# Patient Record
Sex: Female | Born: 1935 | Race: White | Hispanic: No | Marital: Married
Health system: Midwestern US, Academic
[De-identification: ages and names within clinical notes are randomized; demographics above are authoritative.]

---

## 2016-06-17 ENCOUNTER — Encounter: Admit: 2016-06-17 | Discharge: 2016-06-17 | Payer: MEDICARE

## 2016-06-17 NOTE — Telephone Encounter
Called and spoke w/RN she reports transmission is not going through... I provided troubleshooting tips and a call back #    While awaiting return call I noted on the following:"04/11/2016 7:34:30 AM - ... Reviewed with Dr. Dewaine CongerWillhoite - pt is On Hospice - remote monitoring discontined per family's wishes - all cardiac meds d/c'd per family.  [facility]."

## 2016-06-17 NOTE — Telephone Encounter
-----   Message from Jeneen RinksJessica Bower sent at 06/17/2016 12:21 PM CDT -----  Regarding: Remote not going through   Darl PikesSusan with  [facility] calling with questions about PPM monitor connection.  [phone]

## 2016-06-21 ENCOUNTER — Encounter: Admit: 2016-06-21 | Discharge: 2016-06-21 | Payer: MEDICARE

## 2016-06-21 NOTE — Telephone Encounter
-----   Message from Allen NorrisLisa Luikart, RN sent at 06/21/2016 11:15 AM CDT -----  Regarding: Did remote transmit  Husband is not sure if remote transmitted.  It doesn't appear it did, can you call him back [phone].    Thank you!

## 2016-06-21 NOTE — Telephone Encounter
Pt is on hospice and remote d/c in April 2018. Called pt's husband and he forgot that was the conversation in April. He was happy with update and will recycle or trash the carelink monitor. He was happy with the follow up.

## 2016-06-30 ENCOUNTER — Ambulatory Visit: Admit: 2016-06-30 | Discharge: 2016-06-30 | Payer: MEDICARE

## 2016-06-30 DIAGNOSIS — H903 Sensorineural hearing loss, bilateral: Principal | ICD-10-CM

## 2016-06-30 NOTE — Progress Notes
Dispensed 2 packs size 312 hearing aid batteries.

## 2016-08-04 ENCOUNTER — Encounter: Admit: 2016-08-04 | Discharge: 2016-08-04 | Payer: MEDICARE

## 2016-08-10 ENCOUNTER — Encounter: Admit: 2016-08-10 | Discharge: 2016-08-10 | Payer: MEDICARE

## 2016-08-10 DIAGNOSIS — I495 Sick sinus syndrome: Principal | ICD-10-CM

## 2016-08-10 NOTE — Telephone Encounter
Autumn from [facility] called stating that hospice has been discontinued and remote monitoring needs to be resumed per patient wishes.  Flag sent to EP remote.     Autumn [facility] [phone]

## 2016-08-18 ENCOUNTER — Ambulatory Visit: Admit: 2016-08-18 | Discharge: 2016-08-19 | Payer: MEDICARE

## 2016-08-18 DIAGNOSIS — I495 Sick sinus syndrome: Principal | ICD-10-CM

## 2017-03-09 ENCOUNTER — Encounter: Admit: 2017-03-09 | Discharge: 2017-03-09 | Payer: MEDICARE

## 2017-03-13 ENCOUNTER — Encounter: Admit: 2017-03-13 | Discharge: 2017-03-13 | Payer: MEDICARE

## 2017-03-15 ENCOUNTER — Encounter: Admit: 2017-03-15 | Discharge: 2017-03-15 | Payer: MEDICARE

## 2017-03-15 ENCOUNTER — Ambulatory Visit: Admit: 2017-03-15 | Discharge: 2017-03-16 | Payer: MEDICARE

## 2017-03-15 DIAGNOSIS — Z959 Presence of cardiac and vascular implant and graft, unspecified: Principal | ICD-10-CM

## 2017-03-16 ENCOUNTER — Encounter: Admit: 2017-03-16 | Discharge: 2017-03-16 | Payer: MEDICARE

## 2017-03-16 DIAGNOSIS — Z950 Presence of cardiac pacemaker: Principal | ICD-10-CM

## 2017-03-16 DIAGNOSIS — Z959 Presence of cardiac and vascular implant and graft, unspecified: Principal | ICD-10-CM

## 2017-03-21 ENCOUNTER — Ambulatory Visit: Admit: 2017-03-21 | Discharge: 2017-03-22 | Payer: MEDICARE

## 2017-03-21 ENCOUNTER — Encounter: Admit: 2017-03-21 | Discharge: 2017-03-21 | Payer: MEDICARE

## 2017-03-21 DIAGNOSIS — I10 Essential (primary) hypertension: ICD-10-CM

## 2017-03-21 DIAGNOSIS — N189 Chronic kidney disease, unspecified: ICD-10-CM

## 2017-03-21 DIAGNOSIS — G9349 Other encephalopathy: ICD-10-CM

## 2017-03-21 DIAGNOSIS — I4891 Unspecified atrial fibrillation: ICD-10-CM

## 2017-03-21 DIAGNOSIS — I480 Paroxysmal atrial fibrillation: ICD-10-CM

## 2017-03-21 DIAGNOSIS — R42 Dizziness and giddiness: ICD-10-CM

## 2017-03-21 DIAGNOSIS — I471 Supraventricular tachycardia: ICD-10-CM

## 2017-03-21 DIAGNOSIS — IMO0002 Unspecified hypertension complicating pregnancy, childbirth, or the puerperium, unspecified as to episode of care: Principal | ICD-10-CM

## 2017-03-21 DIAGNOSIS — Z950 Presence of cardiac pacemaker: Principal | ICD-10-CM

## 2017-03-21 DIAGNOSIS — I495 Sick sinus syndrome: ICD-10-CM

## 2017-03-21 DIAGNOSIS — E785 Hyperlipidemia, unspecified: ICD-10-CM

## 2017-03-21 DIAGNOSIS — E039 Hypothyroidism, unspecified: ICD-10-CM

## 2017-03-23 ENCOUNTER — Encounter: Admit: 2017-03-23 | Discharge: 2017-03-23 | Payer: MEDICARE

## 2017-04-10 ENCOUNTER — Encounter: Admit: 2017-04-10 | Discharge: 2017-04-10 | Payer: MEDICARE

## 2017-04-14 ENCOUNTER — Encounter: Admit: 2017-04-14 | Discharge: 2017-04-14 | Payer: MEDICARE

## 2017-04-14 ENCOUNTER — Ambulatory Visit: Admit: 2017-04-14 | Discharge: 2017-04-15 | Payer: MEDICARE

## 2017-04-14 DIAGNOSIS — Z950 Presence of cardiac pacemaker: ICD-10-CM

## 2017-04-14 DIAGNOSIS — I480 Paroxysmal atrial fibrillation: Principal | ICD-10-CM

## 2017-04-15 DIAGNOSIS — I480 Paroxysmal atrial fibrillation: ICD-10-CM

## 2017-04-15 DIAGNOSIS — Z950 Presence of cardiac pacemaker: ICD-10-CM

## 2017-04-15 DIAGNOSIS — I495 Sick sinus syndrome: Principal | ICD-10-CM

## 2017-04-18 ENCOUNTER — Ambulatory Visit: Admit: 2017-04-18 | Discharge: 2017-04-19 | Payer: MEDICARE

## 2017-04-18 DIAGNOSIS — Z950 Presence of cardiac pacemaker: Principal | ICD-10-CM

## 2017-05-15 ENCOUNTER — Encounter: Admit: 2017-05-15 | Discharge: 2017-05-15 | Payer: MEDICARE

## 2017-07-19 ENCOUNTER — Encounter: Admit: 2017-07-19 | Discharge: 2017-07-19 | Payer: MEDICARE

## 2017-07-20 ENCOUNTER — Encounter: Admit: 2017-07-20 | Discharge: 2017-07-20 | Payer: MEDICARE

## 2017-07-20 ENCOUNTER — Ambulatory Visit: Admit: 2017-07-19 | Discharge: 2017-07-20 | Payer: MEDICARE

## 2017-07-20 DIAGNOSIS — I495 Sick sinus syndrome: Principal | ICD-10-CM

## 2017-07-20 DIAGNOSIS — I480 Paroxysmal atrial fibrillation: Secondary | ICD-10-CM

## 2017-07-20 DIAGNOSIS — Z950 Presence of cardiac pacemaker: ICD-10-CM

## 2017-07-27 ENCOUNTER — Ambulatory Visit: Admit: 2017-07-27 | Discharge: 2017-07-28 | Payer: MEDICARE

## 2017-07-27 ENCOUNTER — Encounter: Admit: 2017-07-27 | Discharge: 2017-07-27 | Payer: MEDICARE

## 2017-07-27 DIAGNOSIS — G9349 Other encephalopathy: ICD-10-CM

## 2017-07-27 DIAGNOSIS — R42 Dizziness and giddiness: ICD-10-CM

## 2017-07-27 DIAGNOSIS — I10 Essential (primary) hypertension: ICD-10-CM

## 2017-07-27 DIAGNOSIS — I471 Supraventricular tachycardia: ICD-10-CM

## 2017-07-27 DIAGNOSIS — I2510 Atherosclerotic heart disease of native coronary artery without angina pectoris: ICD-10-CM

## 2017-07-27 DIAGNOSIS — I4891 Unspecified atrial fibrillation: ICD-10-CM

## 2017-07-27 DIAGNOSIS — E785 Hyperlipidemia, unspecified: ICD-10-CM

## 2017-07-27 DIAGNOSIS — I495 Sick sinus syndrome: Principal | ICD-10-CM

## 2017-07-27 DIAGNOSIS — Z950 Presence of cardiac pacemaker: ICD-10-CM

## 2017-07-27 DIAGNOSIS — I480 Paroxysmal atrial fibrillation: ICD-10-CM

## 2017-07-27 DIAGNOSIS — IMO0002 Unspecified hypertension complicating pregnancy, childbirth, or the puerperium, unspecified as to episode of care: Principal | ICD-10-CM

## 2017-07-27 DIAGNOSIS — E039 Hypothyroidism, unspecified: Secondary | ICD-10-CM

## 2017-07-27 DIAGNOSIS — N189 Chronic kidney disease, unspecified: ICD-10-CM

## 2017-08-04 LAB — DIGOXIN LEVEL: Lab: 0.9

## 2017-08-07 ENCOUNTER — Encounter: Admit: 2017-08-07 | Discharge: 2017-08-07 | Payer: MEDICARE

## 2017-08-23 ENCOUNTER — Ambulatory Visit: Admit: 2017-08-23 | Discharge: 2017-08-23 | Payer: MEDICARE

## 2017-08-24 ENCOUNTER — Encounter: Admit: 2017-08-24 | Discharge: 2017-08-24 | Payer: MEDICARE

## 2017-09-01 ENCOUNTER — Encounter: Admit: 2017-09-01 | Discharge: 2017-09-01 | Payer: MEDICARE

## 2017-09-07 ENCOUNTER — Encounter: Admit: 2017-09-07 | Discharge: 2017-09-07 | Payer: MEDICARE

## 2017-09-07 ENCOUNTER — Ambulatory Visit: Admit: 2017-09-07 | Discharge: 2017-09-08 | Payer: MEDICARE

## 2017-09-07 DIAGNOSIS — I480 Paroxysmal atrial fibrillation: Principal | ICD-10-CM

## 2017-09-07 DIAGNOSIS — I10 Essential (primary) hypertension: ICD-10-CM

## 2017-09-07 DIAGNOSIS — IMO0002 Unspecified hypertension complicating pregnancy, childbirth, or the puerperium, unspecified as to episode of care: Principal | ICD-10-CM

## 2017-09-07 DIAGNOSIS — R42 Dizziness and giddiness: ICD-10-CM

## 2017-09-07 DIAGNOSIS — E039 Hypothyroidism, unspecified: Secondary | ICD-10-CM

## 2017-09-07 DIAGNOSIS — E785 Hyperlipidemia, unspecified: Secondary | ICD-10-CM

## 2017-09-07 DIAGNOSIS — G9349 Other encephalopathy: ICD-10-CM

## 2017-09-07 DIAGNOSIS — I4891 Unspecified atrial fibrillation: ICD-10-CM

## 2017-09-07 DIAGNOSIS — N189 Chronic kidney disease, unspecified: ICD-10-CM

## 2017-09-07 DIAGNOSIS — I495 Sick sinus syndrome: ICD-10-CM

## 2017-09-07 DIAGNOSIS — I471 Supraventricular tachycardia: ICD-10-CM

## 2017-09-07 MED ORDER — METOPROLOL SUCCINATE 25 MG PO TB24
ORAL_TABLET | ORAL | 3 refills | 90.00000 days | Status: AC | PRN
Start: 2017-09-07 — End: 2017-10-03

## 2017-10-02 ENCOUNTER — Encounter: Admit: 2017-10-02 | Discharge: 2017-10-02 | Payer: MEDICARE

## 2017-10-03 MED ORDER — METOPROLOL SUCCINATE 25 MG PO TB24
ORAL_TABLET | Freq: Every day | ORAL | 3 refills | 90.00000 days | Status: AC | PRN
Start: 2017-10-03 — End: 2017-12-28

## 2017-10-18 ENCOUNTER — Ambulatory Visit: Admit: 2017-10-18 | Discharge: 2017-10-19 | Payer: MEDICARE

## 2017-10-18 ENCOUNTER — Encounter: Admit: 2017-10-18 | Discharge: 2017-10-18 | Payer: MEDICARE

## 2017-10-19 DIAGNOSIS — Z950 Presence of cardiac pacemaker: ICD-10-CM

## 2017-10-19 DIAGNOSIS — I480 Paroxysmal atrial fibrillation: Principal | ICD-10-CM

## 2017-10-30 ENCOUNTER — Encounter: Admit: 2017-10-30 | Discharge: 2017-10-30 | Payer: MEDICARE

## 2017-10-30 DIAGNOSIS — I480 Paroxysmal atrial fibrillation: Principal | ICD-10-CM

## 2017-10-30 DIAGNOSIS — I4891 Unspecified atrial fibrillation: ICD-10-CM

## 2017-11-09 ENCOUNTER — Ambulatory Visit: Admit: 2017-11-09 | Discharge: 2017-11-09 | Payer: MEDICARE

## 2017-11-09 LAB — COMPREHENSIVE METABOLIC PANEL
Lab: 1.1 — ABNORMAL HIGH (ref 0.57–1.11)
Lab: 1.6 — ABNORMAL HIGH (ref 0.0–1.0)
Lab: 102
Lab: 110 — ABNORMAL HIGH (ref 98–107)
Lab: 143 — ABNORMAL HIGH (ref 4.20–5.40)
Lab: 18 — ABNORMAL HIGH (ref 0–14)
Lab: 19 — ABNORMAL LOW (ref 23–31)
Lab: 20 — ABNORMAL LOW (ref 27.0–31.0)
Lab: 22
Lab: 25
Lab: 3.9
Lab: 4.2
Lab: 48 — ABNORMAL LOW (ref >59)
Lab: 7 — ABNORMAL HIGH (ref 11.5–14.5)
Lab: 81
Lab: 9.6

## 2017-11-09 LAB — CBC: Lab: 8.1

## 2017-12-15 ENCOUNTER — Encounter: Admit: 2017-12-15 | Discharge: 2017-12-15 | Payer: MEDICARE

## 2017-12-28 ENCOUNTER — Ambulatory Visit: Admit: 2017-12-28 | Discharge: 2017-12-28 | Payer: MEDICARE

## 2017-12-28 ENCOUNTER — Encounter: Admit: 2017-12-28 | Discharge: 2017-12-28 | Payer: MEDICARE

## 2017-12-28 DIAGNOSIS — I4891 Unspecified atrial fibrillation: ICD-10-CM

## 2017-12-28 DIAGNOSIS — I480 Paroxysmal atrial fibrillation: ICD-10-CM

## 2017-12-28 DIAGNOSIS — I10 Essential (primary) hypertension: ICD-10-CM

## 2017-12-28 DIAGNOSIS — IMO0002 Unspecified hypertension complicating pregnancy, childbirth, or the puerperium, unspecified as to episode of care: Principal | ICD-10-CM

## 2017-12-28 DIAGNOSIS — G9349 Other encephalopathy: ICD-10-CM

## 2017-12-28 DIAGNOSIS — N189 Chronic kidney disease, unspecified: ICD-10-CM

## 2017-12-28 DIAGNOSIS — I495 Sick sinus syndrome: ICD-10-CM

## 2017-12-28 DIAGNOSIS — E785 Hyperlipidemia, unspecified: ICD-10-CM

## 2017-12-28 DIAGNOSIS — R42 Dizziness and giddiness: ICD-10-CM

## 2017-12-28 DIAGNOSIS — E039 Hypothyroidism, unspecified: Secondary | ICD-10-CM

## 2017-12-28 DIAGNOSIS — I471 Supraventricular tachycardia: ICD-10-CM

## 2017-12-28 MED ORDER — DIGOXIN 125 MCG (0.125 MG) PO TAB
125 ug | ORAL_TABLET | Freq: Every day | ORAL | 3 refills | 30.00000 days | Status: AC
Start: 2017-12-28 — End: ?

## 2017-12-28 MED ORDER — FUROSEMIDE 20 MG PO TAB
20 mg | ORAL_TABLET | Freq: Two times a day (BID) | ORAL | 3 refills | 90.00000 days | Status: AC
Start: 2017-12-28 — End: 2018-02-08

## 2017-12-29 ENCOUNTER — Encounter: Admit: 2017-12-29 | Discharge: 2017-12-29 | Payer: MEDICARE

## 2017-12-29 DIAGNOSIS — I4891 Unspecified atrial fibrillation: ICD-10-CM

## 2017-12-29 DIAGNOSIS — I10 Essential (primary) hypertension: Principal | ICD-10-CM

## 2017-12-29 LAB — BASIC METABOLIC PANEL
Lab: 1.1
Lab: 141
Lab: 16
Lab: 4.2
Lab: 72
Lab: 109 — ABNORMAL HIGH (ref 98–107)
Lab: 24
Lab: 9.8

## 2018-01-09 LAB — BASIC METABOLIC PANEL
Lab: 1
Lab: 104
Lab: 14
Lab: 140
Lab: 24
Lab: 4.6
Lab: 92

## 2018-01-09 LAB — DIGOXIN LEVEL: Lab: 1.1 (ref 0.80–2.00)

## 2018-01-11 ENCOUNTER — Encounter: Admit: 2018-01-11 | Discharge: 2018-01-11 | Payer: MEDICARE

## 2018-01-11 DIAGNOSIS — I10 Essential (primary) hypertension: Secondary | ICD-10-CM

## 2018-01-11 DIAGNOSIS — I4891 Unspecified atrial fibrillation: Secondary | ICD-10-CM

## 2018-01-16 ENCOUNTER — Encounter: Admit: 2018-01-16 | Discharge: 2018-01-16 | Payer: MEDICARE

## 2018-01-16 MED ORDER — DILTIAZEM HCL 240 MG PO CP24
240 mg | ORAL_CAPSULE | Freq: Two times a day (BID) | ORAL | 3 refills | 45.00000 days | Status: AC
Start: 2018-01-16 — End: ?

## 2018-01-17 ENCOUNTER — Ambulatory Visit: Admit: 2018-01-17 | Discharge: 2018-01-18 | Payer: MEDICARE

## 2018-01-18 ENCOUNTER — Encounter: Admit: 2018-01-18 | Discharge: 2018-01-18 | Payer: MEDICARE

## 2018-01-18 DIAGNOSIS — I480 Paroxysmal atrial fibrillation: Secondary | ICD-10-CM

## 2018-01-18 DIAGNOSIS — Z950 Presence of cardiac pacemaker: Secondary | ICD-10-CM

## 2018-01-18 DIAGNOSIS — I495 Sick sinus syndrome: Secondary | ICD-10-CM

## 2018-02-08 ENCOUNTER — Encounter: Admit: 2018-02-08 | Discharge: 2018-02-08 | Payer: MEDICARE

## 2018-02-08 ENCOUNTER — Ambulatory Visit: Admit: 2018-02-08 | Discharge: 2018-02-08 | Payer: MEDICARE

## 2018-02-08 DIAGNOSIS — I10 Essential (primary) hypertension: ICD-10-CM

## 2018-02-08 DIAGNOSIS — N189 Chronic kidney disease, unspecified: ICD-10-CM

## 2018-02-08 DIAGNOSIS — E785 Hyperlipidemia, unspecified: ICD-10-CM

## 2018-02-08 DIAGNOSIS — I480 Paroxysmal atrial fibrillation: ICD-10-CM

## 2018-02-08 DIAGNOSIS — I495 Sick sinus syndrome: ICD-10-CM

## 2018-02-08 DIAGNOSIS — I471 Supraventricular tachycardia: ICD-10-CM

## 2018-02-08 DIAGNOSIS — IMO0002 Unspecified hypertension complicating pregnancy, childbirth, or the puerperium, unspecified as to episode of care: Principal | ICD-10-CM

## 2018-02-08 DIAGNOSIS — G9349 Other encephalopathy: ICD-10-CM

## 2018-02-08 DIAGNOSIS — R42 Dizziness and giddiness: ICD-10-CM

## 2018-02-08 DIAGNOSIS — E039 Hypothyroidism, unspecified: Secondary | ICD-10-CM

## 2018-02-08 DIAGNOSIS — I4891 Unspecified atrial fibrillation: ICD-10-CM

## 2018-02-08 LAB — BASIC METABOLIC PANEL
Lab: 1
Lab: 12
Lab: 140
Lab: 16 — ABNORMAL HIGH (ref 0–14)
Lab: 24
Lab: 4.4
Lab: 52 — ABNORMAL LOW (ref >59)
Lab: 9.2
Lab: 96

## 2018-02-08 LAB — DIGOXIN LEVEL: Lab: 1.1

## 2018-02-08 NOTE — Progress Notes
Model Number:  [id number]      Serial Number:  [id number]      Implant Date:  04/27/2011      Carelink -remote monitoring established     ??? Hypothyroidism 06/20/2011     04/08/13 TSH 4.25 when eval'd for atrial fibrillation      ??? Nonobstructive Coronary atherosclerosis 06/20/2011     07/10/11 Reg thall: Normal IN inferior defect possibly ischemia  06/20/11: Normal EF and LVEDP; mild plaquing, but no obstructive CAD, per cath by Dr. Steward Ros  5/15 Reg Thall d/t DOE: EF 55% same inferior defect as prior.      ??? Dyspnea 06/08/2011     05/24/11 CXR [place]  No evident acute cardiopulmonary disease.   05/24/11 BNP 361  05/02/11 PFT's Normal pulmonary function studies.      ??? Sick sinus syndrome (HCC) 04/26/2011     Pacer implant     ??? PAT (paroxysmal atrial tachycardia) (HCC) 03/20/2011     133 bpm on holter, +ve in lead V1 And -ve lead II    03/2010 PAF? In hospital.     ??? Hypertension 03/11/2010   ??? HLD (hyperlipidemia) 03/11/2010   ??? PAF (paroxysmal atrial fibrillation) (HCC) 03/11/2010     PAF Rx Mltaq, Amio both DC d/t vague sx dizziness, not resolved off drugs  04/08/13 AF RVR @ 133 Dr. Gilles Chiquito office oof oral anticoagulation and anti-arrhythmic drug. Bak in SR next day after increase atenolol, Rx Xarelto 20/doay     ??? 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  No history of trauma or falling, although I suspect that a minor trauma in the presence of aspirin might lead to some ecchymoses.  She denies nose bleeds or sinus pain.  No change in her extraocular movement mobility.  No eye pain.    ([id number])        Review of Systems   Constitution: Positive for malaise/fatigue and weight loss.   HENT: Positive for hoarse voice.    Eyes: Positive for blurred vision.   Cardiovascular: Positive for dyspnea on exertion, irregular heartbeat, leg swelling, orthopnea and paroxysmal nocturnal dyspnea. ??? acetaminophen (TYLENOL) 650 mg rectal suppository Insert or Apply 650 mg to rectal area as directed as Needed for Pain.   ??? aspirin EC 81 mg tablet Take 81 mg by mouth daily. Take with food.   ??? Carboxymethylcellulose-Glycern (OPTIVE) 0.5-0.9 % drop Apply 1 drop to both eyes three times daily.   ??? digoxin (LANOXIN) 125 mcg (0.125 mg) tablet Take one tablet by mouth daily.   ??? diltiazem CD (CARDIZEM CD) 240 mg capsule Take one capsule by mouth twice daily.   ??? furosemide (LASIX) 40 mg tablet Take 1 tablet by mouth daily.   ??? levothyroxine (SYNTHROID) 50 mcg tablet Take 50 mcg by mouth daily.   ??? magnesium hydroxide (MILK OF MAGNESIA) 400 mg/5 mL oral suspension Take 30 mL by mouth every 48 hours. Indications: constipation   ??? meclizine (ANTIVERT) 25 mg tablet Take 25 mg by mouth three times daily.   ??? ondansetron (ZOFRAN ODT) 4 mg rapid dissolve tablet Dissolve 4 mg by mouth every 4 hours as needed for Nausea or Vomiting. Place on tongue to disolve.    ??? senna/docusate (SENOKOT-S) 8.6/50 mg tablet Take 1 Tab by mouth twice daily. Take while taking pain medication

## 2018-04-18 ENCOUNTER — Ambulatory Visit: Admit: 2018-04-18 | Discharge: 2018-04-18 | Payer: MEDICARE

## 2018-04-18 DIAGNOSIS — Z950 Presence of cardiac pacemaker: ICD-10-CM

## 2018-04-18 DIAGNOSIS — I495 Sick sinus syndrome: Principal | ICD-10-CM

## 2018-04-18 DIAGNOSIS — I480 Paroxysmal atrial fibrillation: Secondary | ICD-10-CM

## 2018-04-19 ENCOUNTER — Encounter: Admit: 2018-04-19 | Discharge: 2018-04-19 | Payer: MEDICARE

## 2018-04-26 ENCOUNTER — Encounter: Admit: 2018-04-26 | Discharge: 2018-04-26 | Payer: MEDICARE

## 2018-04-27 ENCOUNTER — Encounter: Admit: 2018-04-27 | Discharge: 2018-04-27 | Payer: MEDICARE

## 2018-04-27 NOTE — Telephone Encounter
Pt was scheduled for annual device check only on 05/03/18 in [place]. I called and spoke to husband.  They will send remote on this day.  She has had recent AFib with RVR and thought it best to f/u on this.

## 2018-07-25 ENCOUNTER — Encounter: Admit: 2018-07-25 | Discharge: 2018-07-25

## 2018-07-26 ENCOUNTER — Encounter: Admit: 2018-07-26 | Discharge: 2018-07-26

## 2018-07-26 ENCOUNTER — Ambulatory Visit: Admit: 2018-07-26 | Discharge: 2018-07-26

## 2018-07-26 DIAGNOSIS — I480 Paroxysmal atrial fibrillation: Secondary | ICD-10-CM

## 2018-07-26 DIAGNOSIS — I495 Sick sinus syndrome: Secondary | ICD-10-CM

## 2018-07-26 DIAGNOSIS — Z950 Presence of cardiac pacemaker: Secondary | ICD-10-CM

## 2018-07-26 NOTE — Progress Notes
PPM order entered and routed to [facility] staff for scheduling.    ----- Message -----  From: Mercy Riding  Sent: 07/26/2018   7:36 AM CDT  To: Cvm Nurse [place]  Subject: Device fu                                        Pt is overdue for an in-office device check.

## 2018-07-26 NOTE — Telephone Encounter
I called and LM letting patient's husband know that we received the Carelink transmission.

## 2018-07-26 NOTE — Telephone Encounter
-----   Message from Ayesha Rumpf, RN sent at 07/25/2018 11:39 AM CDT -----  Regarding: Remote Rcv'd? - MDT  Patient/husband was to send missed 7/15 remote interrogation yesterday afternoon. Please make sure it was received & let the husband know. Thank you.   ----- Message -----  From: Melvern Sample  Sent: 07/25/2018  10:32 AM CDT  To: Cherlyn Labella Remote  Subject: device check                                     Husband wants to know when we will check pm on wife

## 2018-08-03 ENCOUNTER — Encounter: Admit: 2018-08-03 | Discharge: 2018-08-03

## 2018-09-05 ENCOUNTER — Encounter: Admit: 2018-09-05 | Discharge: 2018-09-05

## 2018-09-06 ENCOUNTER — Encounter: Admit: 2018-09-06 | Discharge: 2018-09-06

## 2019-01-14 ENCOUNTER — Encounter: Admit: 2019-01-14 | Discharge: 2019-01-14 | Payer: MEDICARE

## 2019-02-08 ENCOUNTER — Encounter: Admit: 2019-02-08 | Discharge: 2019-02-08 | Payer: MEDICARE

## 2019-02-19 ENCOUNTER — Encounter: Admit: 2019-02-19 | Discharge: 2019-02-19 | Payer: MEDICARE

## 2019-02-19 DIAGNOSIS — Z950 Presence of cardiac pacemaker: Secondary | ICD-10-CM

## 2019-02-19 DIAGNOSIS — I495 Sick sinus syndrome: Secondary | ICD-10-CM

## 2019-02-19 DIAGNOSIS — I480 Paroxysmal atrial fibrillation: Secondary | ICD-10-CM

## 2019-02-27 ENCOUNTER — Ambulatory Visit: Admit: 2019-02-27 | Discharge: 2019-02-27 | Payer: MEDICARE

## 2019-02-27 DIAGNOSIS — I495 Sick sinus syndrome: Secondary | ICD-10-CM

## 2019-02-27 DIAGNOSIS — Z950 Presence of cardiac pacemaker: Secondary | ICD-10-CM

## 2019-02-27 DIAGNOSIS — I480 Paroxysmal atrial fibrillation: Secondary | ICD-10-CM

## 2019-03-01 ENCOUNTER — Encounter: Admit: 2019-03-01 | Discharge: 2019-03-01 | Payer: MEDICARE

## 2019-03-01 DIAGNOSIS — I495 Sick sinus syndrome: Secondary | ICD-10-CM

## 2019-03-01 DIAGNOSIS — Z950 Presence of cardiac pacemaker: Secondary | ICD-10-CM

## 2019-03-01 DIAGNOSIS — I480 Paroxysmal atrial fibrillation: Secondary | ICD-10-CM

## 2019-05-24 ENCOUNTER — Encounter: Admit: 2019-05-24 | Discharge: 2019-05-24 | Payer: MEDICARE

## 2019-06-01 IMAGING — CT CT head/brain wo con
3 of 4 series · 14 of 47 positions shown, 16 images · non-contrast
Comparison: none

[Series 4: brain cor 5.00 hr40 s3 · coronal · 0.30mm/px · 3 of 40 slices shown]
[im 14/40  brain]
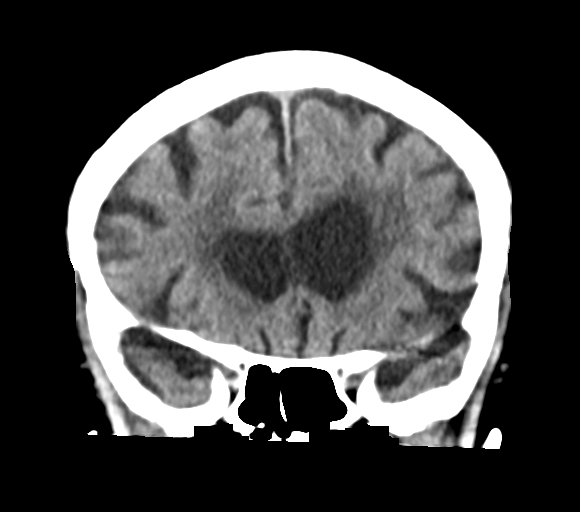
[im 18/40  brain]
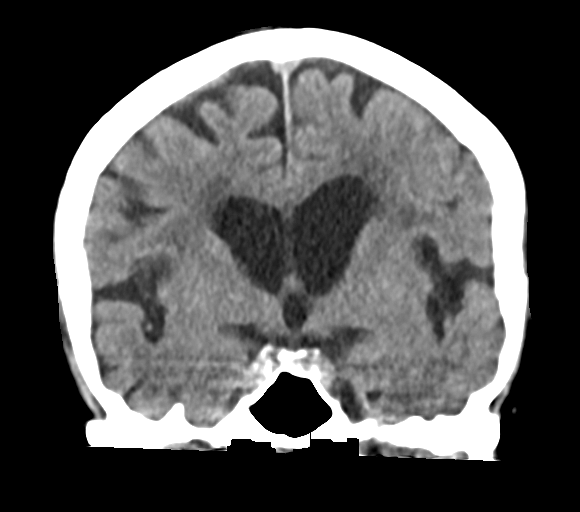
[im 22/40  brain]
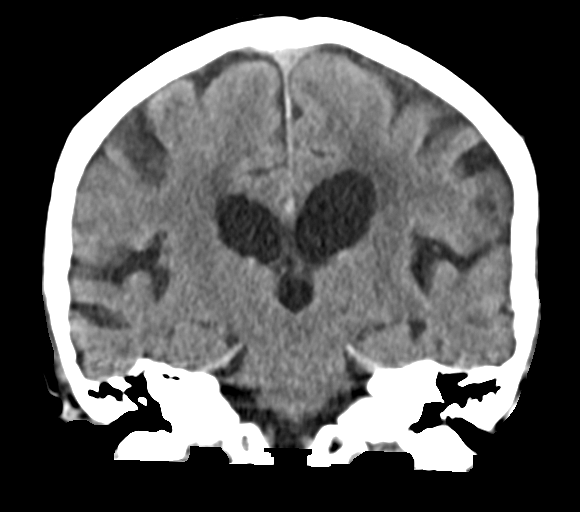

[Series 6: brain sag 5.00 hr40 s3 · sagittal · 0.30mm/px · 3 of 34 slices shown]
[im 12/34  brain]
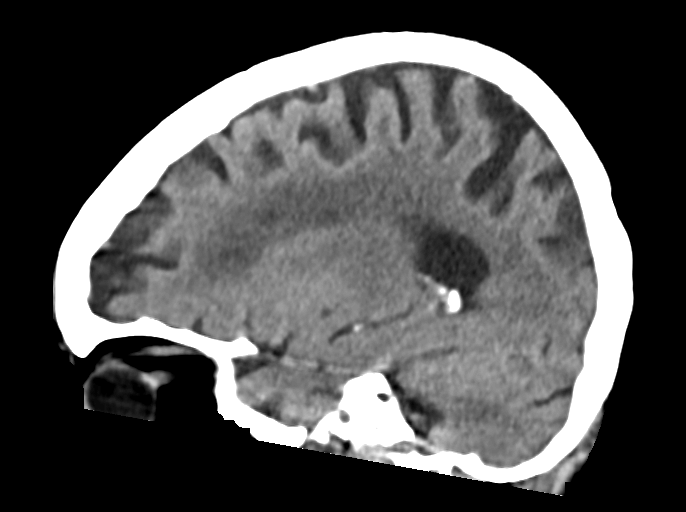
[im 17/34  brain]
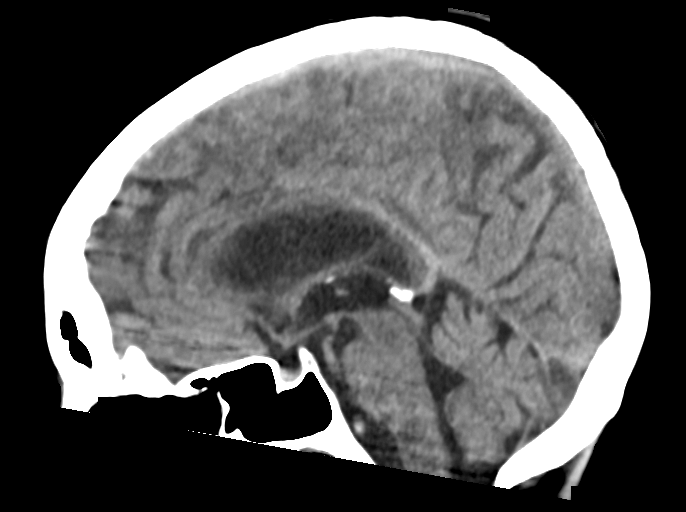
[im 23/34  brain]
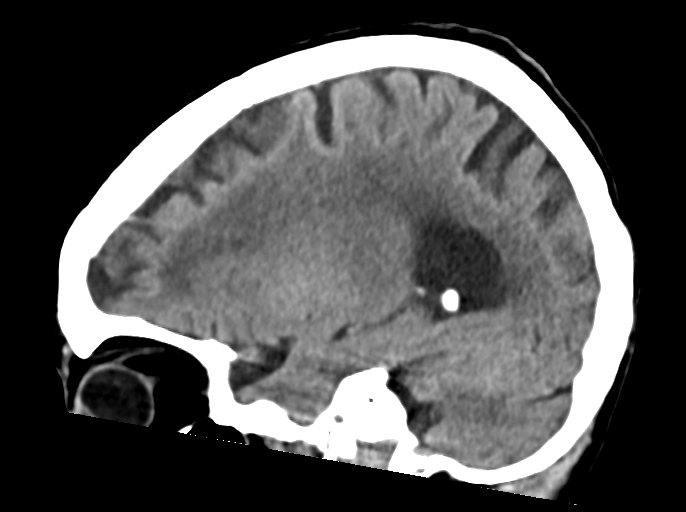

[Series 8: brain ax 5.00 hr60 s3 · axial · 0.34mm/px · z∈[-624,-506]mm · 8 of 30 slices shown, 10 images]
[im 3/30  brain]
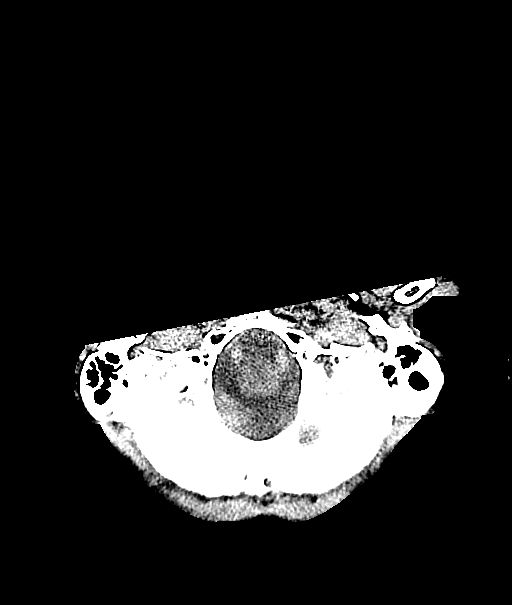
[im 3/30  bone]
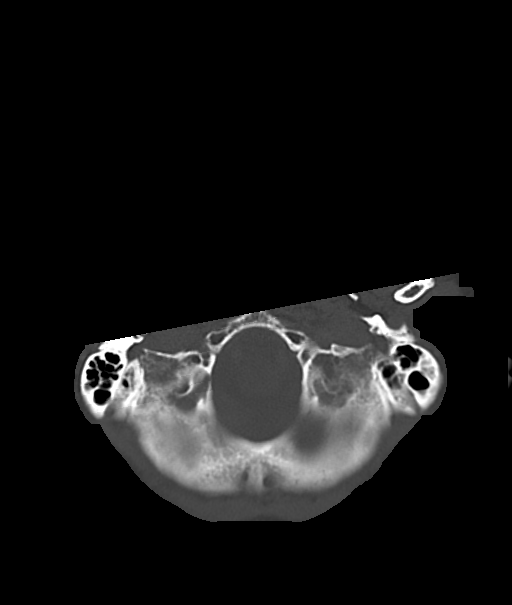
[im 7/30  brain]
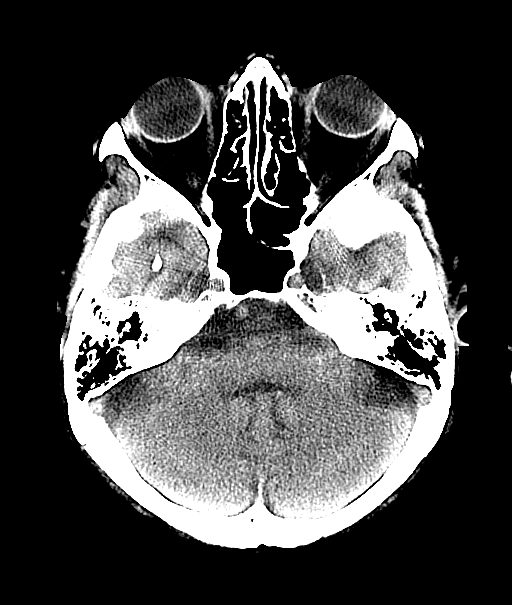
[im 11/30  brain]
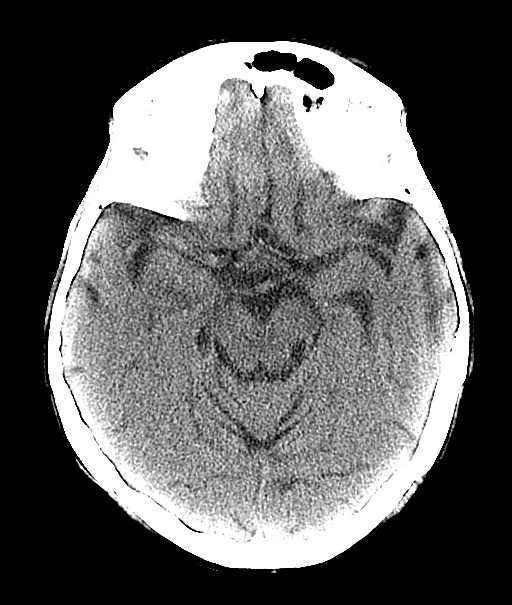
[im 13/30  brain]
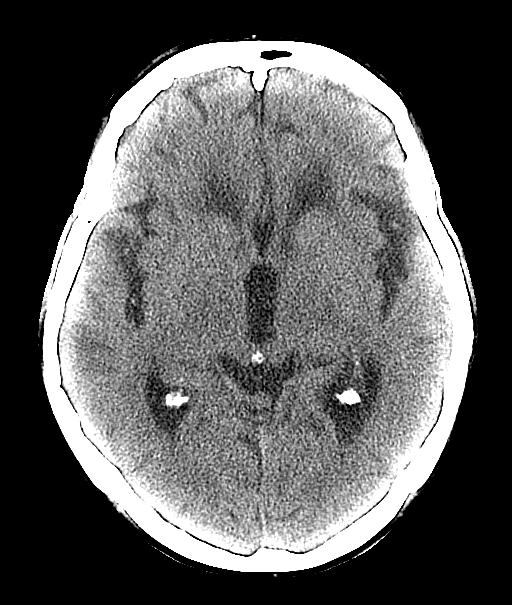
[im 17/30  brain]
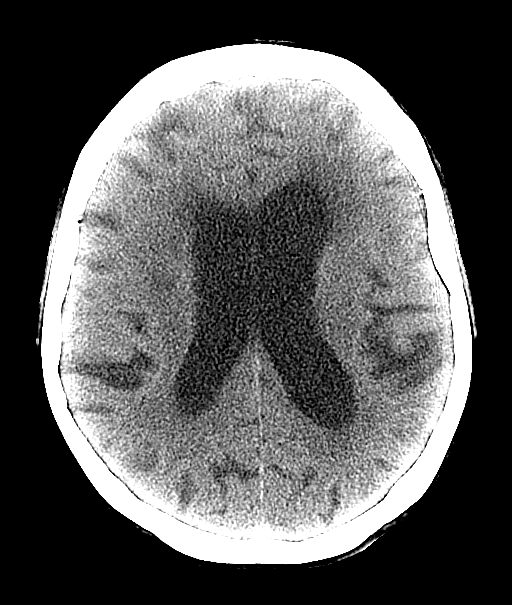
[im 17/30  bone]
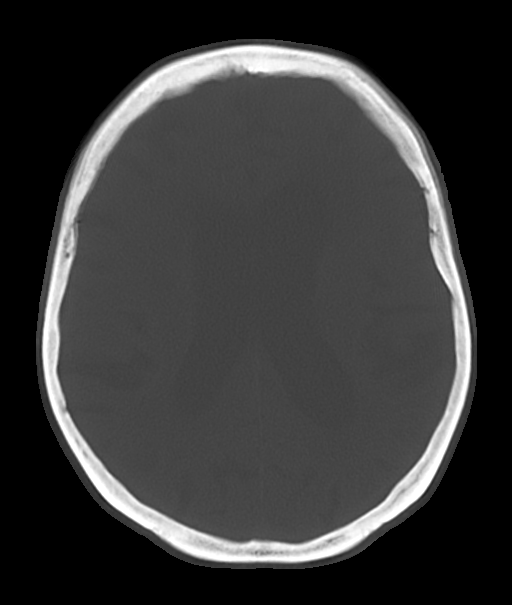
[im 19/30  brain]
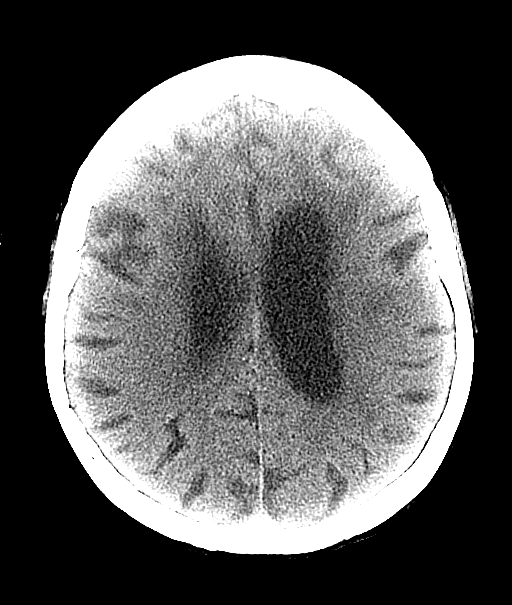
[im 23/30  brain]
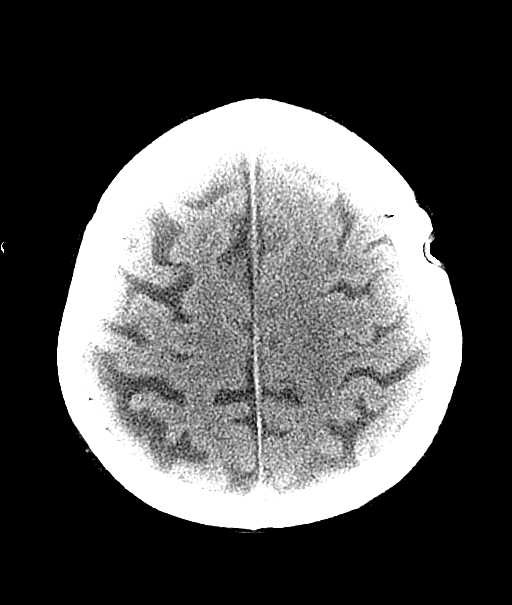
[im 27/30  brain]
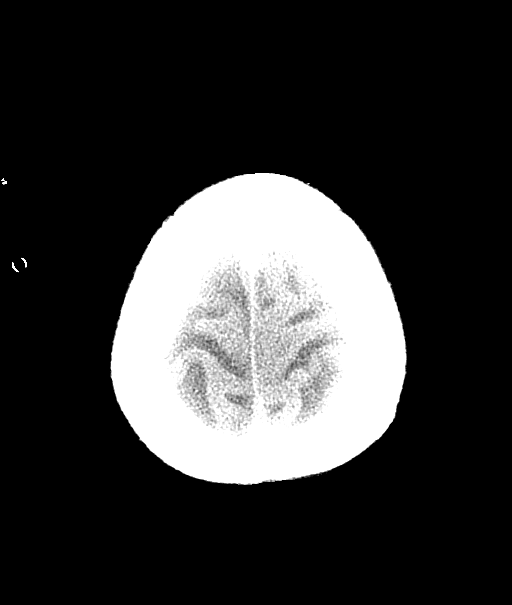

[14 of 47 positions shown; findings below may reference images not displayed]

EXAM
Head CT.

INDICATION
reported facial droop
WEAKNESS X THIS AM. AB/TJ

TECHNIQUE
Noncontrast CT scan of the head. All CT scans at this facility use dose modulation, iterative
reconstruction, and/or weight based dosing when appropriate to reduce radiation dose to as low as
reasonably achievable.

COMPARISONS
None

FINDINGS
There is no intra-axial or extra-axial hemorrhage. Volume loss with chronic small vessel ischemic
change is moderate. Bilateral cataract surgeries. The paranasal sinuses and mastoid air cells are
clear. No midline shift. No mass effect. The gray-white differentiation is otherwise preserved.

IMPRESSION
No evidence of acute intracranial pathology.

Tech Notes:

WEAKNESS X THIS AM. AB/TJ

## 2019-06-01 IMAGING — CR CHEST
1 series · 1 of 1 positions shown · non-contrast
Comparison: none

[chest port x-wise]
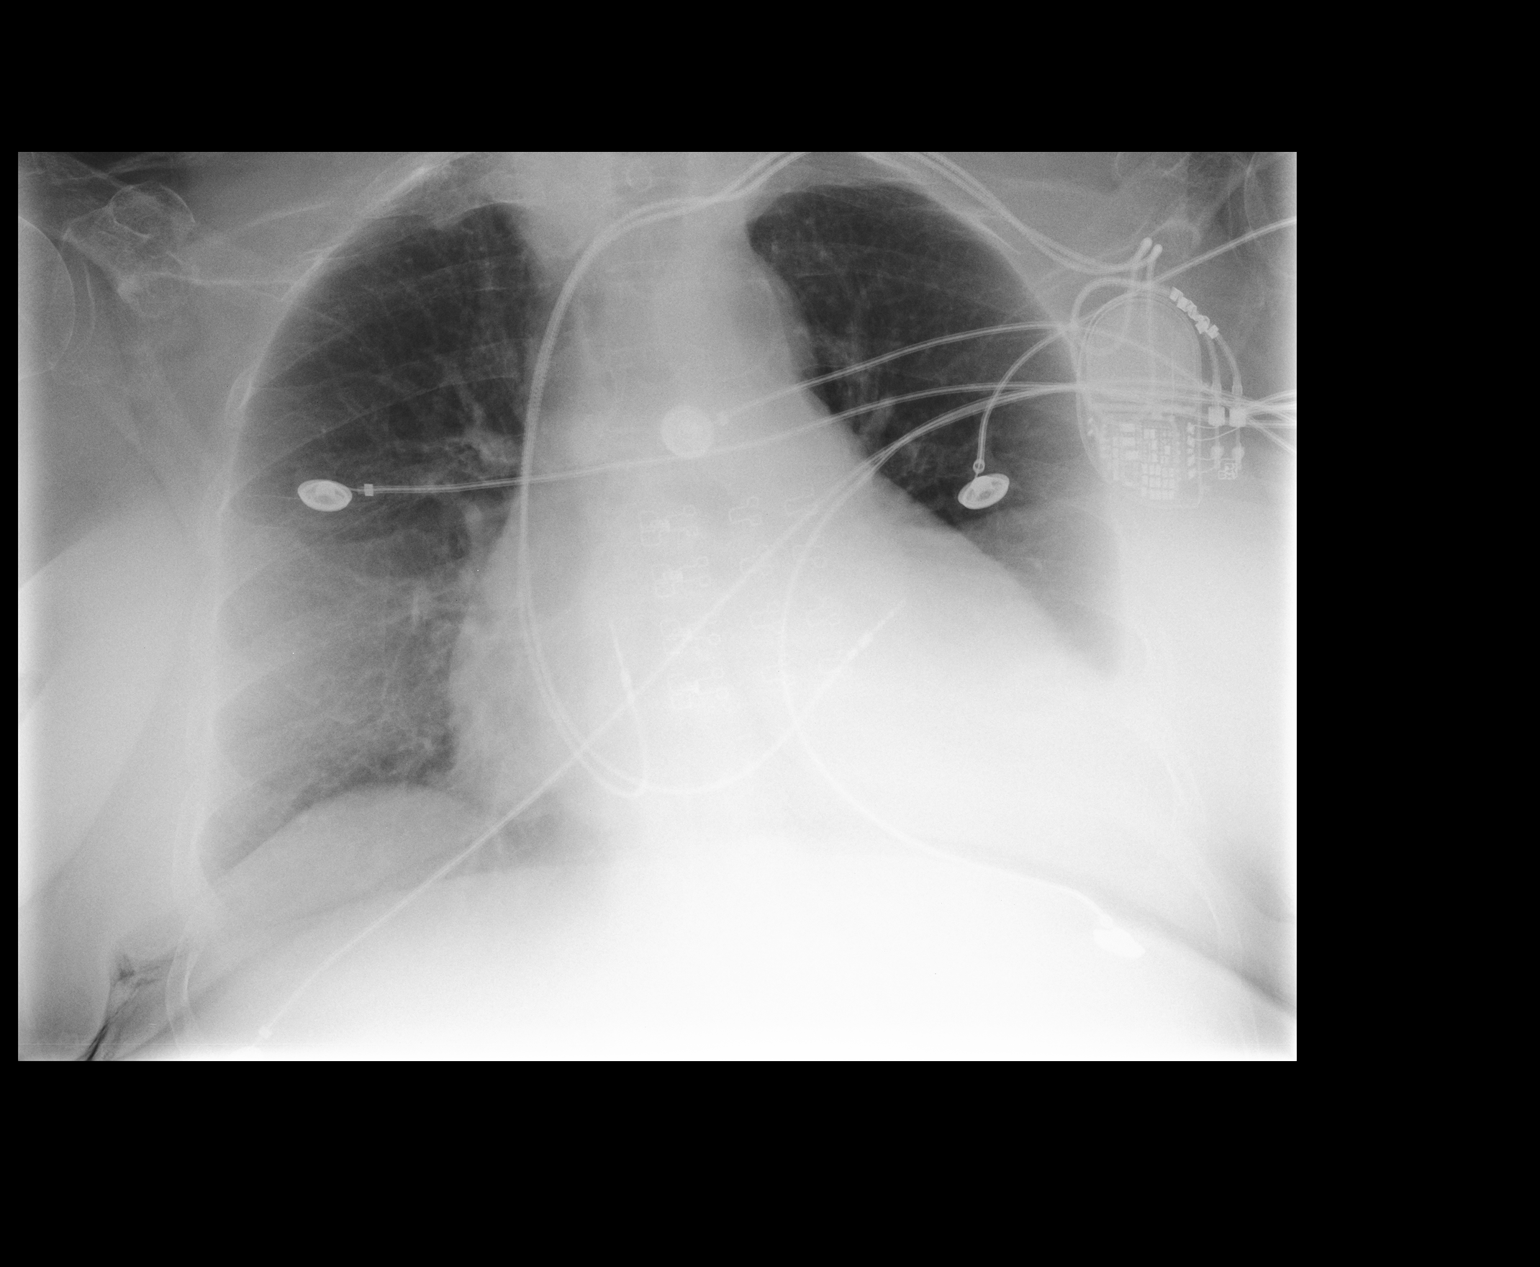

[1 of 1 positions shown; findings below may reference images not displayed]

DIAGNOSTIC STUDIES

EXAM
Chest radiograph.

INDICATION
rapid atrial fib
RAPID A-FIB, HX CHF, PACEMAKER, HTN - AK/JC

TECHNIQUE
AP chest.

COMPARISONS
September 03, 2017.

FINDINGS
Mild cardiomegaly. Dual lead cardiac pacemaker. No consolidation, effusion, or pneumothorax.

IMPRESSION
No significant change.

Tech Notes:

RAPID A-FIB, HX CHF, PACEMAKER, HTN - AK/JC

## 2019-08-22 ENCOUNTER — Encounter: Admit: 2019-08-22 | Discharge: 2019-08-22 | Payer: MEDICARE

## 2019-11-26 ENCOUNTER — Encounter: Admit: 2019-11-26 | Discharge: 2019-11-26 | Payer: MEDICARE

## 2019-11-26 NOTE — Telephone Encounter
Patient was scheduled for a Medtronic Carelink on 11/20/19 that has not been received. Patient was instructed to send a manual transmission. Instructed if the transmitter does not appear to be working properly, they need to contact the device company directly. Patient was provided with that contact number. Requested the patient send [place] a MyChart message or contact our device nurses at [phone] to let us know after they have sent their transmission. Called preferred phone number, Spoke with Danielle Macdonald they will get one sent over today.  Danielle Macdonald verbalized understanding.  CDJ

## 2019-12-31 ENCOUNTER — Encounter: Admit: 2019-12-31 | Discharge: 2019-12-31 | Payer: MEDICARE

## 2019-12-31 DIAGNOSIS — Z950 Presence of cardiac pacemaker: Secondary | ICD-10-CM

## 2020-03-03 ENCOUNTER — Encounter: Admit: 2020-03-03 | Discharge: 2020-03-03 | Payer: MEDICARE

## 2020-03-09 ENCOUNTER — Encounter: Admit: 2020-03-09 | Discharge: 2020-03-09 | Payer: MEDICARE

## 2020-03-12 ENCOUNTER — Encounter: Admit: 2020-03-12 | Discharge: 2020-03-12 | Payer: MEDICARE

## 2020-03-12 DIAGNOSIS — Z950 Presence of cardiac pacemaker: Secondary | ICD-10-CM

## 2020-04-23 ENCOUNTER — Encounter: Admit: 2020-04-23 | Discharge: 2020-04-23 | Payer: MEDICARE

## 2020-04-23 NOTE — Telephone Encounter
VM left from [facility] about remote monitoring. Wife and Husband are roommates at this facility. Patients husband is the point of contact for remote transmissions and gets nervous sending them. Pt expressed to staff that he would like Bruin to contact the nursing team at the facility when transmissions are due. Will notify team and do this moving forward. Nursing staff made aware that next transmission is due May 21st, 2022. Nurse (sarah) at facility expressed she would help with transmission.    [employer] [phone]

## 2020-04-23 NOTE — Telephone Encounter
Patient was scheduled for a Medtronic Carelink on 04/10/20 that has not been received. Patient was instructed to send a manual transmission. Instructed if the transmitter does not appear to be working properly, they need to contact the device company directly. Patient was provided with that contact number. Requested the patient send us a MyChart message or contact our device nurses at [phone] to let us know after they have sent their transmission. Patient verbalized understanding.

## 2020-04-27 ENCOUNTER — Encounter: Admit: 2020-04-27 | Discharge: 2020-04-27 | Payer: MEDICARE

## 2020-05-26 ENCOUNTER — Encounter: Admit: 2020-05-26 | Discharge: 2020-05-26 | Payer: MEDICARE

## 2020-05-27 ENCOUNTER — Encounter: Admit: 2020-05-27 | Discharge: 2020-05-27 | Payer: MEDICARE

## 2020-06-16 ENCOUNTER — Encounter: Admit: 2020-06-16 | Discharge: 2020-06-16 | Payer: MEDICARE

## 2020-06-16 DIAGNOSIS — IMO0002 Unspecified hypertension complicating pregnancy, childbirth, or the puerperium, unspecified as to episode of care: Secondary | ICD-10-CM

## 2020-06-16 DIAGNOSIS — I10 Essential (primary) hypertension: Secondary | ICD-10-CM

## 2020-06-16 DIAGNOSIS — I495 Sick sinus syndrome: Secondary | ICD-10-CM

## 2020-06-16 DIAGNOSIS — R42 Dizziness and giddiness: Secondary | ICD-10-CM

## 2020-06-16 DIAGNOSIS — I471 Supraventricular tachycardia: Secondary | ICD-10-CM

## 2020-06-16 DIAGNOSIS — N189 Chronic kidney disease, unspecified: Secondary | ICD-10-CM

## 2020-06-16 DIAGNOSIS — G9349 Other encephalopathy: Secondary | ICD-10-CM

## 2020-06-16 DIAGNOSIS — E785 Hyperlipidemia, unspecified: Secondary | ICD-10-CM

## 2020-06-16 DIAGNOSIS — E039 Hypothyroidism, unspecified: Secondary | ICD-10-CM

## 2020-06-16 DIAGNOSIS — I480 Paroxysmal atrial fibrillation: Secondary | ICD-10-CM

## 2020-06-16 DIAGNOSIS — I4891 Unspecified atrial fibrillation: Secondary | ICD-10-CM

## 2020-06-17 ENCOUNTER — Encounter: Admit: 2020-06-17 | Discharge: 2020-06-17 | Payer: MEDICARE

## 2020-06-17 NOTE — Telephone Encounter
Spoke With [employer] at [facility] and provided him with information below.

## 2020-06-17 NOTE — Telephone Encounter
Patient's family sent remote device in. Received notice from Darl Pikes on our device team that patient has 2 months until ERI.     Discussed results with Dr. Geronimo Boot. He recommends patient be seen by EP for further evaluation and for possible generator change procedure.

## 2020-06-17 NOTE — Telephone Encounter
Patient monthly check for June was received on 06/17/20. Next check is scheduled for 07/18/20. Letter sent to address in chart.   CDJ

## 2020-06-24 ENCOUNTER — Encounter: Admit: 2020-06-24 | Discharge: 2020-06-24 | Payer: MEDICARE

## 2020-06-25 ENCOUNTER — Encounter: Admit: 2020-06-25 | Discharge: 2020-06-25 | Payer: MEDICARE

## 2020-07-08 ENCOUNTER — Encounter: Admit: 2020-07-08 | Discharge: 2020-07-08 | Payer: MEDICARE

## 2020-07-08 ENCOUNTER — Ambulatory Visit: Admit: 2020-07-08 | Discharge: 2020-07-08 | Payer: MEDICARE

## 2020-07-08 DIAGNOSIS — E785 Hyperlipidemia, unspecified: Secondary | ICD-10-CM

## 2020-07-08 DIAGNOSIS — I495 Sick sinus syndrome: Secondary | ICD-10-CM

## 2020-07-08 DIAGNOSIS — I471 Supraventricular tachycardia: Secondary | ICD-10-CM

## 2020-07-08 DIAGNOSIS — G9349 Other encephalopathy: Secondary | ICD-10-CM

## 2020-07-08 DIAGNOSIS — N189 Chronic kidney disease, unspecified: Secondary | ICD-10-CM

## 2020-07-08 DIAGNOSIS — IMO0002 Unspecified hypertension complicating pregnancy, childbirth, or the puerperium, unspecified as to episode of care: Secondary | ICD-10-CM

## 2020-07-08 DIAGNOSIS — R42 Dizziness and giddiness: Secondary | ICD-10-CM

## 2020-07-08 DIAGNOSIS — I480 Paroxysmal atrial fibrillation: Secondary | ICD-10-CM

## 2020-07-08 DIAGNOSIS — Z950 Presence of cardiac pacemaker: Secondary | ICD-10-CM

## 2020-07-08 DIAGNOSIS — E039 Hypothyroidism, unspecified: Secondary | ICD-10-CM

## 2020-07-08 DIAGNOSIS — I4891 Unspecified atrial fibrillation: Secondary | ICD-10-CM

## 2020-07-08 DIAGNOSIS — I10 Essential (primary) hypertension: Secondary | ICD-10-CM

## 2020-07-08 NOTE — Progress Notes
Telehealth Visit Note    Date of Service: 07/08/2020    Subjective:      Obtained patient's verbal consent to treat them and their agreement to [facility] financial policy and NPP via this telehealth visit during the [facility] Emergency       Danielle Macdonald is a 87 y.o. female.    History of Present Illness  Danielle Macdonald presents today in electrophysiology follow-up for a history of atrial arrhythmias and permanent pacemaker.  As you know, she is a 87 year old female with a past medical history of atrial fibrillation, sinus node dysfunction status post permanent pacemaker, and advanced dementia who is here today to discuss treatment options.  Danielle Macdonald, her husband, and her daughter are meeting with me by phone.  Apparently, Danielle Macdonald device is nearing elective replacement interval.  They spoke with her primary cardiologist Dr. Elijah Birk [place] who recommended evaluation by me.    Danielle Macdonald daughter tells me that her mother is fairly sedentary.  She is mostly in bed or in a chair all day.  Patient lives in a nursing home with her husband.  She has had multiple falls and is not on anticoagulation.  The patient herself cannot give me history as to whether she is having chest discomfort, shortness of breath, dizziness, or syncope due to her dementia.  Her family thinks she is stable.    I have a chance to review her last device interrogation.  It shows stable sensing and pacing thresholds.  She is in atrial fibrillation and paced at 60 bpm.  Overall V pacing percentage is 52%.  She is approximately 3 months to elective replacement interval.       Review of Systems   All other systems reviewed and are negative.        Objective:         ? acetaminophen (TYLENOL) 325 mg tablet Take 2 Tabs by mouth every 4 hours as needed.   ? acetaminophen (TYLENOL) 650 mg rectal suppository Insert or Apply 650 mg to rectal area as directed as Needed for Pain.   ? Carboxymethylcellulose-Glycern 0.5-0.9 % drop Apply 1 drop to both eyes three times daily.   ? clopiDOGrel (PLAVIX) 75 mg tablet Take 75 mg by mouth daily.   ? digoxin (LANOXIN) 125 mcg (0.125 mg) tablet Take one tablet by mouth daily.   ? diltiazem CD (CARDIZEM CD) 240 mg capsule Take one capsule by mouth twice daily.   ? FLORAJEN DIGESTION 15 billion cell cap    ? furosemide (LASIX) 40 mg tablet Take 20 mg by mouth daily.   ? levothyroxine (SYNTHROID) 50 mcg tablet Take 50 mcg by mouth daily.   ? magnesium hydroxide (MILK OF MAGNESIA) 400 mg/5 mL oral suspension Take 30 mL by mouth every 48 hours. Indications: constipation   ? meclizine (ANTIVERT) 25 mg tablet Take 25 mg by mouth three times daily.   ? memantine (NAMENDA) 5 mg tablet Take 5 mg by mouth daily.   ? ondansetron (ZOFRAN ODT) 4 mg rapid dissolve tablet Dissolve 4 mg by mouth every 4 hours as needed for Nausea or Vomiting. Place on tongue to disolve.    ? potassium chloride SR (K-DUR) 20 mEq tablet    ? prazosin (MINIPRESS) 1 mg capsule Take 1 mg by mouth daily.   ? senna/docusate (SENOKOT-S) 8.6/50 mg tablet Take 1 Tab by mouth twice daily. Take while taking pain medication  Computed Telehealth Body Mass Index unavailable. Necessary lab results were not found in the last year.    Physical Exam  Deferred due to phone call       Assessment and Plan:    Problem   A-Fib (Hcc)   Cardiac Pacemaker in Situ    04/27/11 Medronic DDD Pacer implant KUH   Manufacturer:  Medtronic      Device Type:  DDD-PM      Model Number:  [id number]      Serial Number:  [id number]      Implant Date:  04/27/2011      Carelink -remote monitoring established     Paf (Paroxysmal Atrial Fibrillation) (Hcc)    PAF Rx Mltaq, Amio both DC d/t vague sx dizziness, not resolved off drugs  04/08/13 AF RVR @ 133 Dr. Gilles Chiquito office oof oral anticoagulation and anti-arrhythmic drug. Bak in SR next day after increase atenolol, Rx Xarelto 20/doay         Cardiac pacemaker in situ  Her device is close to elective replacement interval.  In looking at her last remote, she is primarily V paced at a lower rate limit of 60 bpm.  She is in permanent atrial fibrillation.    I talked at length with the patient's husband and daughter by phone regarding pacemaker generator change.  Danielle Macdonald herself has severe dementia and is basically bedbound.  I am not sure a generator change will significantly impact her quality of life.  The family does not want to do any procedures unless necessary.  After a long discussion, we have decided to reprogram her device to VVI 40.  I would like to see if the family notices any difference in her quality of life with essentially no ventricular pacing.  She is also on Coreg as well as digoxin and Cardizem.  I am going to discontinue her Coreg today to allow her heart rates to come up a little bit.  I would like to aim for heart rate around 80 bpm.  She may also we may also need to stop her digoxin.    PAF (paroxysmal atrial fibrillation) (HCC)  She remains in atrial fibrillation with a relatively slow ventricular response.  Danielle Macdonald is not on anticoagulation due to frequent falls.  We are going to discontinue her Coreg to see if increasing heart rate will allow her to V pace less.    We will ask the device representatives to go to the nursing home for the necessary programming changes.  I anticipate another device check in one month.    We will keep you up to date with results of procedures as they occur.             20 minutes spent on this patient's encounter with counseling and coordination of care taking >50% of the visit.

## 2020-07-08 NOTE — Assessment & Plan Note
She remains in atrial fibrillation with a relatively slow ventricular response.  Danielle Macdonald is not on anticoagulation due to frequent falls.  We are going to discontinue her Coreg to see if increasing heart rate will allow her to V pace less.

## 2020-07-08 NOTE — Telephone Encounter
Spoke to [place], LPN from [employer].     Informed her of RCP recommendations for patient.  -d/c coreg  - a remote transmission in 1 month  - A Medtronic rep will be calling to schedule a time next week to come out and reprogram patient's device.     Lillia Abed, LPN states understanding and request we send over RCP note from visit. No further questions or concerns.

## 2020-07-08 NOTE — Assessment & Plan Note
Her device is close to elective replacement interval.  In looking at her last remote, she is primarily V paced at a lower rate limit of 60 bpm.  She is in permanent atrial fibrillation.    I talked at length with the patient's husband and daughter by phone regarding pacemaker generator change.  Mrs. Hooley herself has severe dementia and is basically bedbound.  I am not sure a generator change will significantly impact her quality of life.  The family does not want to do any procedures unless necessary.  After a long discussion, we have decided to reprogram her device to VVI 40.  I would like to see if the family notices any difference in her quality of life with essentially no ventricular pacing.  She is also on Coreg as well as digoxin and Cardizem.  I am going to discontinue her Coreg today to allow her heart rates to come up a little bit.  I would like to aim for heart rate around 80 bpm.  She may also we may also need to stop her digoxin.

## 2020-07-20 ENCOUNTER — Encounter: Admit: 2020-07-20 | Discharge: 2020-07-20 | Payer: MEDICARE

## 2020-07-21 ENCOUNTER — Encounter: Admit: 2020-07-21 | Discharge: 2020-07-21 | Payer: MEDICARE

## 2020-07-23 ENCOUNTER — Encounter: Admit: 2020-07-23 | Discharge: 2020-07-23 | Payer: MEDICARE

## 2020-07-23 ENCOUNTER — Ambulatory Visit: Admit: 2020-07-23 | Discharge: 2020-07-23 | Payer: MEDICARE

## 2020-07-23 DIAGNOSIS — Z959 Presence of cardiac and vascular implant and graft, unspecified: Secondary | ICD-10-CM

## 2020-08-26 ENCOUNTER — Encounter: Admit: 2020-08-26 | Discharge: 2020-08-26 | Payer: MEDICARE

## 2020-08-26 NOTE — Telephone Encounter
Patient was scheduled for a Medtronic Carelink on 08/15/20 that has not been received. Patient was instructed to send a manual transmission. Instructed if the transmitter does not appear to be working properly, they need to contact the device company directly. Patient was provided with that contact number. Requested the patient send [place] a MyChart message or contact our device nurses at [phone] to let us know after they have sent their transmission. Spoke with patients daughter she will call the nursing home and ask them to please have them send in a transmission. BC     Note: Patient needs to send a manual transmission as we did not receive their scheduled remote interrogation.

## 2020-09-04 ENCOUNTER — Encounter: Admit: 2020-09-04 | Discharge: 2020-09-04 | Payer: MEDICARE

## 2020-09-15 ENCOUNTER — Encounter: Admit: 2020-09-15 | Discharge: 2020-09-15 | Payer: MEDICARE

## 2020-09-20 ENCOUNTER — Encounter: Admit: 2020-09-20 | Discharge: 2020-09-20 | Payer: MEDICARE

## 2020-10-12 ENCOUNTER — Encounter: Admit: 2020-10-12 | Discharge: 2020-10-12 | Payer: MEDICARE

## 2020-10-13 ENCOUNTER — Encounter: Admit: 2020-10-13 | Discharge: 2020-10-13 | Payer: MEDICARE

## 2020-10-14 ENCOUNTER — Encounter: Admit: 2020-10-14 | Discharge: 2020-10-14 | Payer: MEDICARE

## 2020-11-09 ENCOUNTER — Encounter: Admit: 2020-11-09 | Discharge: 2020-11-09 | Payer: MEDICARE

## 2020-11-10 ENCOUNTER — Encounter: Admit: 2020-11-10 | Discharge: 2020-11-10 | Payer: MEDICARE

## 2020-12-08 ENCOUNTER — Encounter: Admit: 2020-12-08 | Discharge: 2020-12-08 | Payer: MEDICARE

## 2020-12-17 ENCOUNTER — Encounter: Admit: 2020-12-17 | Discharge: 2020-12-17 | Payer: MEDICARE

## 2020-12-17 ENCOUNTER — Ambulatory Visit: Admit: 2020-12-17 | Discharge: 2020-12-17 | Payer: MEDICARE

## 2020-12-17 DIAGNOSIS — I480 Paroxysmal atrial fibrillation: Secondary | ICD-10-CM

## 2020-12-17 DIAGNOSIS — Z950 Presence of cardiac pacemaker: Secondary | ICD-10-CM

## 2021-01-06 ENCOUNTER — Encounter: Admit: 2021-01-06 | Discharge: 2021-01-06 | Payer: MEDICARE

## 2021-01-10 ENCOUNTER — Encounter: Admit: 2021-01-10 | Discharge: 2021-01-10 | Payer: MEDICARE

## 2021-01-11 ENCOUNTER — Encounter: Admit: 2021-01-11 | Discharge: 2021-01-11 | Payer: MEDICARE

## 2021-01-11 NOTE — Telephone Encounter
Spoke with the patient's daughter Victorino Dike. She states the patient is now on hospice. The hospice doctor does not think she will tolerate a gen change. The daughter states there hasnt been much change since she was programmed to VVI 40. Will make RCP aware

## 2021-01-11 NOTE — Telephone Encounter
-----   Message from Anner CreteSusan Morrissey, RN sent at 01/10/2021  2:54 PM CST -----  Regarding: RRT on 12.28.22/< 3 mo to ERI.  # Per last note with RCP unsure if pt will undergo Gen change due to physical/mental state

## 2021-02-04 ENCOUNTER — Encounter: Admit: 2021-02-04 | Discharge: 2021-02-04 | Payer: MEDICARE

## 2021-02-04 NOTE — Telephone Encounter
See telephone note from 1/9.

## 2021-02-04 NOTE — Telephone Encounter
-----   Message from Jerl Mina, RN sent at 02/04/2021 12:06 PM CST -----  Good Afternoon,  Pt PM has reached ERI.  She is on hospice right now.  I would let the daughter know as we wont replace the device.  Thanks,  Diane

## 2021-02-05 ENCOUNTER — Encounter: Admit: 2021-02-05 | Discharge: 2021-02-05 | Payer: MEDICARE

## 2021-03-06 ENCOUNTER — Encounter: Admit: 2021-03-06 | Discharge: 2021-03-06 | Payer: MEDICARE

## 2021-03-08 ENCOUNTER — Encounter: Admit: 2021-03-08 | Discharge: 2021-03-08 | Payer: MEDICARE

## 2021-03-08 NOTE — Telephone Encounter
-----   Message from Anner Crete, RN sent at 03/06/2021 11:14 AM CST -----  Regarding: Stop monthly remotes. Pt on hospice. GENerator change will not be done.  thanks

## 2021-04-29 ENCOUNTER — Encounter: Admit: 2021-04-29 | Discharge: 2021-04-29 | Payer: MEDICARE

## 2021-04-29 VITALS — BP 108/66 | HR 65 | Ht 65.0 in

## 2021-04-29 DIAGNOSIS — I480 Paroxysmal atrial fibrillation: Principal | ICD-10-CM

## 2021-04-29 DIAGNOSIS — I10 Essential (primary) hypertension: Secondary | ICD-10-CM

## 2021-04-29 DIAGNOSIS — I495 Sick sinus syndrome: Secondary | ICD-10-CM

## 2021-04-29 DIAGNOSIS — I5021 Acute systolic (congestive) heart failure: Secondary | ICD-10-CM

## 2021-04-29 DIAGNOSIS — E039 Hypothyroidism, unspecified: Secondary | ICD-10-CM

## 2021-04-29 DIAGNOSIS — G9349 Other encephalopathy: Secondary | ICD-10-CM

## 2021-04-29 DIAGNOSIS — E785 Hyperlipidemia, unspecified: Secondary | ICD-10-CM

## 2021-04-29 DIAGNOSIS — I4891 Unspecified atrial fibrillation: Secondary | ICD-10-CM

## 2021-04-29 DIAGNOSIS — IMO0002 Unspecified hypertension complicating pregnancy, childbirth, or the puerperium, unspecified as to episode of care: Secondary | ICD-10-CM

## 2021-04-29 DIAGNOSIS — N189 Chronic kidney disease, unspecified: Secondary | ICD-10-CM

## 2021-04-29 DIAGNOSIS — R42 Dizziness and giddiness: Secondary | ICD-10-CM

## 2021-04-29 DIAGNOSIS — I471 Supraventricular tachycardia: Secondary | ICD-10-CM

## 2021-04-29 DIAGNOSIS — Z950 Presence of cardiac pacemaker: Secondary | ICD-10-CM

## 2021-04-29 NOTE — Patient Instructions
Thank you for visiting our office today.    We would like to make the following medication adjustments:  NONE      Otherwise continue the same medications as you have been doing.          We will be pursuing the following tests after your appointment today:            Follow up as needed. Please call us in the meantime with any questions or concerns.        Please allow 5-7 business days for our providers to review your results. All normal results will go to MyChart. If you do not have Mychart, it is strongly recommended to get this so you can easily view all your results. If you do not have mychart, we will attempt to call you once with normal lab and testing results. If we cannot reach you by phone with normal results, we will send you a letter.  If you have not heard the results of your testing after one week please give [place] a call.       Your Cardiovascular Medicine [place] Team Brett Canales, Pilar Jarvis, Shawna [place], and [place])  phone number is [phone].

## 2021-08-03 DEATH — deceased

## 2021-09-24 ENCOUNTER — Encounter: Admit: 2021-09-24 | Discharge: 2021-09-24 | Payer: MEDICARE

## 2021-11-05 ENCOUNTER — Encounter: Admit: 2021-11-05 | Discharge: 2021-11-05 | Payer: MEDICARE

## 2023-11-04 DEATH — deceased
